# Patient Record
Sex: Female | Born: 1990 | Race: Black or African American | Hispanic: No | Marital: Single | Smoking: Current some day smoker
Health system: Southern US, Community
[De-identification: ages and names within clinical notes are randomized; demographics above are authoritative.]

## PROBLEM LIST (undated history)

## (undated) DIAGNOSIS — G43909 Migraine, unspecified, not intractable, without status migrainosus: Secondary | ICD-10-CM

## (undated) HISTORY — PX: WISDOM TOOTH EXTRACTION: SHX21

---

## 2009-11-12 ENCOUNTER — Emergency Department: Payer: Self-pay | Admitting: Unknown Physician Specialty

## 2010-03-29 ENCOUNTER — Emergency Department (HOSPITAL_COMMUNITY)
Admission: EM | Admit: 2010-03-29 | Discharge: 2010-03-29 | Payer: Self-pay | Source: Home / Self Care | Admitting: Emergency Medicine

## 2010-03-29 LAB — DIFFERENTIAL
Basophils Absolute: 0.1 10*3/uL (ref 0.0–0.1)
Lymphocytes Relative: 25 % (ref 12–46)
Lymphs Abs: 2 10*3/uL (ref 0.7–4.0)
Monocytes Absolute: 0.4 10*3/uL (ref 0.1–1.0)
Neutro Abs: 5.8 10*3/uL (ref 1.7–7.7)

## 2010-03-29 LAB — URINE MICROSCOPIC-ADD ON

## 2010-03-29 LAB — URINALYSIS, ROUTINE W REFLEX MICROSCOPIC
Specific Gravity, Urine: 1.033 — ABNORMAL HIGH (ref 1.005–1.030)
Urine Glucose, Fasting: NEGATIVE mg/dL
Urobilinogen, UA: 1 mg/dL (ref 0.0–1.0)
pH: 6 (ref 5.0–8.0)

## 2010-03-29 LAB — BASIC METABOLIC PANEL
CO2: 21 mEq/L (ref 19–32)
Chloride: 111 mEq/L (ref 96–112)
GFR calc Af Amer: >60 mL/min (ref >60)
Sodium: 140 mEq/L (ref 135–145)

## 2010-03-29 LAB — CBC
HCT: 39.5 % (ref 36.0–46.0)
Hemoglobin: 13.1 g/dL (ref 12.0–15.0)
WBC: 8.3 10*3/uL (ref 4.0–10.5)

## 2010-11-01 ENCOUNTER — Inpatient Hospital Stay (INDEPENDENT_AMBULATORY_CARE_PROVIDER_SITE_OTHER)
Admission: RE | Admit: 2010-11-01 | Discharge: 2010-11-01 | Disposition: A | Discharge status: Home / Self Care | Payer: Medicaid Other | Source: Ambulatory Visit | Attending: Emergency Medicine | Admitting: Emergency Medicine

## 2010-11-01 DIAGNOSIS — K089 Disorder of teeth and supporting structures, unspecified: Secondary | ICD-10-CM

## 2011-02-25 ENCOUNTER — Encounter: Payer: Self-pay | Admitting: *Deleted

## 2011-02-25 ENCOUNTER — Emergency Department (HOSPITAL_COMMUNITY)
Admission: EM | Admit: 2011-02-25 | Discharge: 2011-02-25 | Disposition: A | Discharge status: Home / Self Care | Payer: Medicaid Other | Attending: Emergency Medicine | Admitting: Emergency Medicine

## 2011-02-25 DIAGNOSIS — K0889 Other specified disorders of teeth and supporting structures: Secondary | ICD-10-CM

## 2011-02-25 DIAGNOSIS — K089 Disorder of teeth and supporting structures, unspecified: Secondary | ICD-10-CM | POA: Insufficient documentation

## 2011-02-25 DIAGNOSIS — R112 Nausea with vomiting, unspecified: Secondary | ICD-10-CM | POA: Insufficient documentation

## 2011-02-25 DIAGNOSIS — M2550 Pain in unspecified joint: Secondary | ICD-10-CM | POA: Insufficient documentation

## 2011-02-25 DIAGNOSIS — R110 Nausea: Secondary | ICD-10-CM

## 2011-02-25 DIAGNOSIS — F17200 Nicotine dependence, unspecified, uncomplicated: Secondary | ICD-10-CM | POA: Insufficient documentation

## 2011-02-25 DIAGNOSIS — IMO0001 Reserved for inherently not codable concepts without codable children: Secondary | ICD-10-CM | POA: Insufficient documentation

## 2011-02-25 MED ORDER — HYDROCODONE-ACETAMINOPHEN 5-325 MG PO TABS: 2.0000 | ORAL_TABLET | ORAL | Status: AC | PRN

## 2011-02-25 MED ORDER — ONDANSETRON HCL 4 MG PO TABS: 4.0000 mg | ORAL_TABLET | Freq: Four times a day (QID) | ORAL | Status: AC

## 2011-02-25 MED ORDER — ONDANSETRON 4 MG PO TBDP
4.0000 mg | ORAL_TABLET | Freq: Once | ORAL | Status: AC
  Administered 2011-02-25: 4 mg via ORAL
  Filled 2011-02-25: qty 1

## 2011-02-25 MED ORDER — IBUPROFEN 800 MG PO TABS
800.0000 mg | ORAL_TABLET | Freq: Once | ORAL | Status: AC
  Administered 2011-02-25: 800 mg via ORAL
  Filled 2011-02-25: qty 1

## 2011-02-25 NOTE — ED Provider Notes (Signed)
History     CSN: [id number]  Arrival date & time 02/25/11  1716   First MD Initiated Contact with Patient 02/25/11 1733      Chief Complaint  Patient presents with  . Dental Pain    (Consider location/radiation/quality/duration/timing/severity/associated sxs/prior treatment) HPI Comments: Patient presents with right upper incisor pain for the past week. Denies any pressure to the tooth, difficulty breathing or swallowing. She also complaints of 2 weeks of nausea with one 2 episodes of emesis a day. No abdominal pain, chest pain, diarrhea or fever. She also has some pain in her right wrist after hitting it against a window 2 weeks ago. Denies any weakness numbness or tingling.  The history is provided by the patient.    History reviewed. No pertinent past medical history.  History reviewed. No pertinent past surgical history.  History reviewed. No pertinent family history.  History  Substance Use Topics  . Smoking status: Passive Smoker    Types: Cigarettes  . Smokeless tobacco: Not on file  . Alcohol Use: Yes    OB History    Grav Para Term Preterm Abortions TAB SAB Ect Mult Living                  Review of Systems  Constitutional: Positive for appetite change. Negative for fever and activity change.  HENT: Positive for dental problem. Negative for trouble swallowing.   Respiratory: Negative for cough, chest tightness and shortness of breath.   Cardiovascular: Negative for chest pain.  Gastrointestinal: Positive for nausea and vomiting. Negative for abdominal pain.  Genitourinary: Negative for dysuria and hematuria.  Musculoskeletal: Positive for myalgias and arthralgias.  Skin: Negative for rash.  Neurological: Negative for weakness and headaches.    Allergies  Review of patient's allergies indicates no known allergies.  Home Medications   Current Outpatient Rx  Name Route Sig Dispense Refill  . EXCEDRIN PO Oral Take 2 tablets by mouth once as needed. For  headaches     . MICONAZOLE NITRATE 1200-2 MG-% VA KIT Vaginal Place 1 each vaginally once.      Marland Kitchen HYDROCODONE-ACETAMINOPHEN 5-325 MG PO TABS Oral Take 2 tablets by mouth every 4 (four) hours as needed for pain. 10 tablet 0  . ONDANSETRON HCL 4 MG PO TABS Oral Take 1 tablet (4 mg total) by mouth every 6 (six) hours. 12 tablet 0    BP 120/74  Pulse 84  Temp(Src) 98.8 F (37.1 C) (Oral)  Resp 14  Ht 5' (1.524 m)  Wt 115 lb (52.164 kg)  BMI 22.46 kg/m2  SpO2 100%  Physical Exam  Constitutional: She is oriented to person, place, and time. She appears well-developed and well-nourished. No distress.  HENT:  Head: Normocephalic and atraumatic.  Mouth/Throat: Oropharynx is clear and moist. No oropharyngeal exudate.       Right upper incisor tender to palpation, no abscess, Floor of mouth soft, no trismus  Eyes: Conjunctivae and EOM are normal. Pupils are equal, round, and reactive to light.  Neck: Normal range of motion. Neck supple.  Cardiovascular: Normal rate, regular rhythm and normal heart sounds.   Pulmonary/Chest: Effort normal and breath sounds normal.  Abdominal: Soft. There is no tenderness. There is no rebound and no guarding.  Musculoskeletal: Normal range of motion. She exhibits no edema and no tenderness.  Neurological: She is alert and oriented to person, place, and time. No cranial nerve deficit.  Skin: Skin is warm.    ED Course  Procedures (including critical  care time)   Labs Reviewed  PREGNANCY, URINE   No results found.   1. Pain, dental   2. Nausea       MDM  Dental pain without evidence of abscess., Nausea and vomiting with no abdominal pain. Abdomen soft nontender on exam. Patient well-hydrated.  Pregnancy test checked given complaint of nausea. Tolerating by mouth in the department with no emesis.       Glynn Octave, MD 02/25/11 [phone]

## 2011-02-25 NOTE — ED Notes (Signed)
Patient with tooth pain for about a week and also nauseated for 2 weeks

## 2011-04-29 ENCOUNTER — Emergency Department (HOSPITAL_COMMUNITY)
Admission: EM | Admit: 2011-04-29 | Discharge: 2011-04-29 | Disposition: A | Discharge status: Home / Self Care | Payer: Medicaid Other | Attending: Emergency Medicine | Admitting: Emergency Medicine

## 2011-04-29 ENCOUNTER — Encounter (HOSPITAL_COMMUNITY): Payer: Self-pay | Admitting: *Deleted

## 2011-04-29 ENCOUNTER — Emergency Department (HOSPITAL_COMMUNITY): Payer: Medicaid Other

## 2011-04-29 DIAGNOSIS — K029 Dental caries, unspecified: Secondary | ICD-10-CM | POA: Insufficient documentation

## 2011-04-29 DIAGNOSIS — K0889 Other specified disorders of teeth and supporting structures: Secondary | ICD-10-CM

## 2011-04-29 DIAGNOSIS — R220 Localized swelling, mass and lump, head: Secondary | ICD-10-CM | POA: Insufficient documentation

## 2011-04-29 DIAGNOSIS — M25569 Pain in unspecified knee: Secondary | ICD-10-CM | POA: Insufficient documentation

## 2011-04-29 DIAGNOSIS — K089 Disorder of teeth and supporting structures, unspecified: Secondary | ICD-10-CM | POA: Insufficient documentation

## 2011-04-29 MED ORDER — FLUCONAZOLE 150 MG PO TABS: 150.0000 mg | ORAL_TABLET | Freq: Once | ORAL | Status: AC

## 2011-04-29 MED ORDER — PENICILLIN V POTASSIUM 500 MG PO TABS: 500.0000 mg | ORAL_TABLET | Freq: Four times a day (QID) | ORAL | Status: AC

## 2011-04-29 MED ORDER — IBUPROFEN 800 MG PO TABS: 800.0000 mg | ORAL_TABLET | Freq: Three times a day (TID) | ORAL | Status: AC

## 2011-04-29 MED ORDER — FLUCONAZOLE 150 MG PO TABS: 150.0000 mg | ORAL_TABLET | Freq: Once | ORAL | Status: DC

## 2011-04-29 NOTE — ED Provider Notes (Signed)
History     CSN: [id number]  Arrival date & time 04/29/11  1637   First MD Initiated Contact with Patient 04/29/11 1640      Chief Complaint  Patient presents with  . Dental Pain  . Knee Pain    (Consider location/radiation/quality/duration/timing/severity/associated sxs/prior treatment) HPI Comments: Patient is sexually active and is also concerned that she may be pregnant.  Patient is requesting a pregnancy test.  Patient is a 21 y.o. female presenting with tooth pain and knee pain. The history is provided by the patient.  Dental PainThe primary symptoms include mouth pain. Primary symptoms do not include dental injury, oral bleeding, oral lesions, fever or sore throat. Episode onset: 3-4 weeks ago. The symptoms are worsening.  Additional symptoms include: dental sensitivity to temperature, gum swelling, gum tenderness and taste disturbance. Additional symptoms do not include: purulent gums, trismus, facial swelling, trouble swallowing, drooling, ear pain and swollen glands. Medical issues do not include: periodontal disease.   Knee Pain This is a new problem. Episode onset: 3-4 weeks. The problem has been unchanged. Pertinent negatives include no chills, fever, joint swelling, neck pain, numbness, rash, sore throat, swollen glands or weakness. The symptoms are aggravated by bending and walking. She has tried NSAIDs for the symptoms. The treatment provided mild relief.    History reviewed. No pertinent past medical history.  Past Surgical History  Procedure Date  . Wisdom tooth extraction     No family history on file.  History  Substance Use Topics  . Smoking status: Passive Smoker    Types: Cigarettes  . Smokeless tobacco: Not on file  . Alcohol Use: Yes    OB History    Grav Para Term Preterm Abortions TAB SAB Ect Mult Living                  Review of Systems  Constitutional: Negative for fever and chills.  HENT: Negative for ear pain, sore throat, facial  swelling, drooling, trouble swallowing, neck pain and neck stiffness.   Musculoskeletal: Negative for joint swelling and gait problem.  Skin: Negative for rash.  Neurological: Negative for dizziness, syncope, weakness and numbness.    Allergies  Aspirin  Home Medications   Current Outpatient Rx  Name Route Sig Dispense Refill  . EXCEDRIN PO Oral Take 2 tablets by mouth daily as needed. For headaches    . IBUPROFEN 200 MG PO TABS Oral Take 400 mg by mouth every 8 (eight) hours as needed. For pain.      BP 116/69  Pulse 83  Temp(Src) 98.6 F (37 C) (Oral)  Resp 22  Ht 5' (1.524 m)  Wt 123 lb (55.792 kg)  BMI 24.02 kg/m2  SpO2 100%  LMP 04/11/2011  Physical Exam  Nursing note and vitals reviewed. Constitutional: She is oriented to person, place, and time. She appears well-developed and well-nourished. No distress.  HENT:  Head: Normocephalic and atraumatic. No trismus in the jaw.  Right Ear: Tympanic membrane normal.  Left Ear: Tympanic membrane normal.  Nose: Nose normal.  Mouth/Throat: Uvula is midline, oropharynx is clear and moist and mucous membranes are normal. She does not have dentures. No oral lesions. Abnormal dentition. Dental caries present. No dental abscesses or uvula swelling.       No trismus.   No submandibular tightness or swelling. No abscess visualized. Mild swelling and tenderness of gingiva.   Right upper tooth decay.    Neck: Normal range of motion. Neck supple.  Cardiovascular: Normal rate,  regular rhythm and normal heart sounds.   Pulmonary/Chest: Effort normal and breath sounds normal. No respiratory distress.  Musculoskeletal: Normal range of motion.       Right knee: She exhibits bony tenderness. She exhibits normal range of motion, no swelling, no effusion, no ecchymosis, no deformity, no erythema, no LCL laxity, normal patellar mobility, normal meniscus and no MCL laxity. tenderness found.  Neurological: She is alert and oriented to person,  place, and time.  Skin: Skin is warm and dry. No rash noted. She is not diaphoretic. No erythema.  Psychiatric: She has a normal mood and affect.    ED Course  Procedures (including critical care time)  Labs Reviewed - No data to display No results found.   No diagnosis found.    MDM  Patient with toothache.  No gross abscess.  Exam unconcerning for Ludwig's angina or spread of infection.  Will treat with penicillin and pain medicine.  Urged patient to follow-up with dentist.  Patient with negative xrays of knee.  Patient given knee sleeve.          Pascal Lux Richmond, PA-C 04/30/11 0126

## 2011-04-29 NOTE — Discharge Instructions (Signed)
You have a dental injury. Use the resource guide listed below to help you find a dentist if you do not already have one to followup with. It is very important that you get evaluated by a dentist as soon as possible. Call tomorrow to schedule an appointment.. Take your full course of antibiotics. Read the instructions below.  Eat a soft or liquid diet and rinse your mouth out after meals with warm water. You should see a dentist or return here at once if you have increased swelling, increased pain or uncontrolled bleeding from the site of your injury.   SEEK MEDICAL CARE IF:   You have increased pain not controlled with medicines.   You have swelling around your tooth, in your face or neck.   You have bleeding which starts, continues, or gets worse.   You have a fever >101  If you are unable to open your mouth  RESOURCE GUIDE  Dental Problems  Patients with Medicaid: [facility] [phone] [address]                                           [phone] [address] Phone:  [phone]                                                  Phone:  [phone]  If unable to pay or uninsured, contact:  [facility] or [facility]. to become qualified for the adult dental clinic.  Chronic Pain Problems Contact [facility]  [phone] Patients need to be referred by their primary care doctor.  Insufficient Money for Medicine Contact [employer]:  call "211" or [facility] 228-104-0208.  No Primary Care Doctor Call [facility]  [phone] Other agencies that provide inexpensive medical care    [facility]  [phone]    [facility]  [phone]    [facility]  [phone]    [facility]  [phone]    [facility]  [phone]    [facility]  [phone]  [facility]  [phone] [facility] Services  [phone] [facility]  Mental Health   [phone] (emergency services [phone])  Substance Abuse Resources [facility]  [phone] [facility] [phone] [facility] [phone] Floydene Flock [phone] Residential & Outpatient Substance Abuse Program  ([phone]  Abuse/Neglect [facility] Child Abuse Hotline 830-795-0127 [facility] Child Abuse Hotline (458)504-5139 (After Hours)  Emergency Shelter [facility] [phone]  [facility] [place] [facility] [phone] [facility] [phone]  MRSA Hotline #:   (727)787-1907    [facility] Resources  [facility]     [employer]                          [facility] Dept. [address] [place]  [address]      [address]  Blondell Reveal Phone:  [phone]                                   Phone:  [phone]                 Phone:  [phone]  [facility] Mental Health Phone:  [phone]  [facility] Child Abuse Hotline [phone] 731-212-4687 (After Hours)   Be sure to read and understand instructions below prior to leaving the hospital. If your symptoms persist without any improvement in 1 week it is recommended that you follow up with your primary care physician.   Knee Effusion  The medical term for having fluid in your knee is effusion.This means something is wrong inside the knee. Some of the causes of fluid in the knee may be torn cartilage, a torn ligament, or bleeding into the joint from an injury. Small tears may heal on their own with conservative treatment. Conservative means rest, limited weight bearing activity and muscle strengthening exercises. Your recovery may take up to 6 weeks. Larger tears may require surgery.   TREATMENT  Rest, ice, elevation, and  compression are the basic modes of treatment.   Apply ice to the sore area for 15 to 20 minutes, 3 to 4 times per day. Do this while you are awake for the first 2 days, or as directed. This can be stopped when the swelling goes away. Put the ice in a plastic bag and place a towel between the bag of ice and your skin.  Keep your leg elevated when possible to lessen swelling.  If your caregiver recommends crutches, use them as instructed for 1 week. Then, you may walk as tolerated.  Do not drive a vehicle on pain medication. ACTIVITY:            - Weight bearing as tolerated            - Exercises should be limited to pain free range of motion  Knee Immobilization:: This is used to support and protect an injured or painful knee. Knee immobilizers keep your knee from being used while it is healing.  Use powder to control irritation from sweat and friction.  Adjust the immobilizer to be firm but not tight. Signs of an immobilizer that is too tight include:   Swelling.   Numbness.   Color change in your foot or ankle.   Increased pain.  While resting, raise your leg above the level of your heart. This reduces throbbing and helps healing. Prop it up with pillows.  Remove the immobilizer to bathe and sleep. Wear it other times until you see your doctor again.               SEEK MEDICAL CARE IF:  You have an increase in bruising, swelling, or pain.  Your toes feel cold.  Pain relief is not achieved with medications.  EMERGENCY:: Your toes are numb or blue or you have severe pain.  You notice redness, swelling, warmth or increasing pain in your knee.  An unexplained oral temperature above 102 F (38.9 C) develops.  COLD THERAPY DIRECTIONS:  Ice or gel packs can be used to reduce both pain and swelling. Ice is the most helpful within the first 24 to 48 hours after an injury or flareup from overusing a muscle or joint.  Ice is effective, has very few side effects, and is safe for most people to  use.   If you expose your skin to cold temperatures for too long or without the proper protection, you can damage your skin or nerves. Watch for signs of skin damage due to cold.   HOME CARE INSTRUCTIONS  Follow these tips to use ice and cold packs safely.  Place a dry or damp towel between the ice and skin. A damp towel will cool the skin more quickly, so you may need to shorten the time that the ice is used.  For a more rapid response, add gentle compression to the ice.  Ice for no more than 10 to 20 minutes at a time. The bonier the area you are icing, the less time it will take to get the benefits of ice.  Check your skin after 5 minutes to make sure there are no signs of a poor response to cold or skin damage.  Rest 20 minutes or more in between uses.  Once your skin is numb, you can end your treatment. You can test numbness by very lightly touching your skin. The touch should be so light that you do not see the skin dimple from the pressure of your fingertip. When using ice, most people will feel these normal sensations in this order: cold, burning, aching, and numbness.  Do not use ice on someone who cannot communicate their responses to pain, such as small children or people with dementia.   HOW TO MAKE AN ICE PACK  To make an ice pack, do one of the following:  Place crushed ice or a bag of frozen vegetables in a sealable plastic bag. Squeeze out the excess air. Place this bag inside another plastic bag. Slide the bag into a pillowcase or place a damp towel between your skin and the bag.  Mix 3 parts water with 1 part rubbing alcohol. Freeze the mixture in a sealable plastic bag. When you remove the mixture from the freezer, it will be slushy. Squeeze out the excess air. Place this bag inside another plastic bag. Slide the bag into a pillowcase or place a damp towel between your s

## 2011-04-29 NOTE — ED Notes (Signed)
Ortho called to apply knee sleeve 

## 2011-04-29 NOTE — Progress Notes (Signed)
No pcp listed for pt.  Spoke with pt who confirms she has moved from another [employer] about a year ago but did not obtain a new [place] [employer]. Provided pt with [facility] contact number for assistance with finding a medicaid pcp near her residence and other [place] resources and possible available medicaid pcps

## 2011-04-29 NOTE — ED Notes (Signed)
Pt states she thinks her filling has came out of her tooth. Pt states she fills as if air is getting into her tooth. Pt denies any fever. Pt states she is also having right knee pain. Pt denies any fall she states her knee just hurts

## 2011-04-30 LAB — POCT PREGNANCY, URINE: Preg Test, Ur: NEGATIVE

## 2011-05-01 NOTE — ED Provider Notes (Signed)
Medical screening examination/treatment/procedure(s) were performed by non-physician practitioner and as supervising physician I was immediately available for consultation/collaboration. Keslee Harrington Y.   Geraldo Haris Y. Kelley Knoth, MD 05/01/11 0117 

## 2011-06-05 ENCOUNTER — Emergency Department (HOSPITAL_COMMUNITY)
Admission: EM | Admit: 2011-06-05 | Discharge: 2011-06-05 | Disposition: A | Discharge status: Home / Self Care | Payer: Self-pay | Attending: Emergency Medicine | Admitting: Emergency Medicine

## 2011-06-05 ENCOUNTER — Encounter (HOSPITAL_COMMUNITY): Payer: Self-pay | Admitting: Emergency Medicine

## 2011-06-05 DIAGNOSIS — K0889 Other specified disorders of teeth and supporting structures: Secondary | ICD-10-CM

## 2011-06-05 DIAGNOSIS — F17200 Nicotine dependence, unspecified, uncomplicated: Secondary | ICD-10-CM | POA: Insufficient documentation

## 2011-06-05 DIAGNOSIS — K029 Dental caries, unspecified: Secondary | ICD-10-CM | POA: Insufficient documentation

## 2011-06-05 DIAGNOSIS — Z886 Allergy status to analgesic agent status: Secondary | ICD-10-CM | POA: Insufficient documentation

## 2011-06-05 MED ORDER — PENICILLIN V POTASSIUM 500 MG PO TABS: 500.0000 mg | ORAL_TABLET | Freq: Three times a day (TID) | ORAL | Status: AC

## 2011-06-05 NOTE — Discharge Instructions (Signed)
Dental Pain  A tooth ache may be caused by cavities (tooth decay). Cavities expose the nerve of the tooth to air and hot or cold temperatures. It may come from an infection or abscess (also called a boil or furuncle) around your tooth. It is also often caused by dental caries (tooth decay). This causes the pain you are having.  DIAGNOSIS   Your caregiver can diagnose this problem by exam.  TREATMENT   · If caused by an infection, it may be treated with medications which kill germs (antibiotics) and pain medications as prescribed by your caregiver. Take medications as directed.  · Only take over-the-counter or prescription medicines for pain, discomfort, or fever as directed by your caregiver.  · Whether the tooth ache today is caused by infection or dental disease, you should see your dentist as soon as possible for further care.  SEEK MEDICAL CARE IF:  The exam and treatment you received today has been provided on an emergency basis only. This is not a substitute for complete medical or dental care. If your problem worsens or new problems (symptoms) appear, and you are unable to meet with your dentist, call or return to this location.  SEEK IMMEDIATE MEDICAL CARE IF:   · You have a fever.  · You develop redness and swelling of your face, jaw, or neck.  · You are unable to open your mouth.  · You have severe pain uncontrolled by pain medicine.  MAKE SURE YOU:   · Understand these instructions.  · Will watch your condition.  · Will get help right away if you are not doing well or get worse.  Document Released: 02/18/2005 Document Revised: 02/07/2011 Document Reviewed: 10/07/2007  [employer]® Patient Information ©2012 [employer].

## 2011-06-05 NOTE — ED Notes (Signed)
Pt missed scheduled appt with dr hatcher today; now being referred to oral surgeon for tooth extraction; pt called and given phone number for dr Chales Salmon  [phone] to make appt.

## 2011-06-05 NOTE — ED Notes (Signed)
Pt reports that this is second visit for same concern. Unable to follow up with dentist after last visit

## 2011-06-05 NOTE — ED Provider Notes (Signed)
Medical screening examination/treatment/procedure(s) were performed by non-physician practitioner and as supervising physician I was immediately available for consultation/collaboration.  Doug Sou, MD 06/05/11 1606

## 2011-06-05 NOTE — ED Provider Notes (Signed)
History     CSN: [id number]  Arrival date & time 06/05/11  1123   First MD Initiated Contact with Patient 06/05/11 1138      No chief complaint on file.   (Consider location/radiation/quality/duration/timing/severity/associated sxs/prior treatment) HPI  A healthy 21 year old female presents with a chief complaint of dental pain. Patient states she has been having pain to the right upper premolar for the past 6 weeks. Pain is intermittent, gradual onset, usually lasting for minutes to hours, and improves with ibuprofen. States pain worsened with cold air, cold water or hot water. She denies fever, headache, throat swelling, rash, recent trauma, neck pain, or jaw pain. States she has been seen by a dentist 4 weeks ago, who mentioned that she would benefit from having a dental extraction or root canal. Patient states she cannot afford for the procedure at this time. She is here requesting for pain control. Patient is not a smoker.  No past medical history on file.  Past Surgical History  Procedure Date  . Wisdom tooth extraction     No family history on file.  History  Substance Use Topics  . Smoking status: Passive Smoker    Types: Cigarettes  . Smokeless tobacco: Not on file  . Alcohol Use: Yes    OB History    Grav Para Term Preterm Abortions TAB SAB Ect Mult Living                  Review of Systems  Constitutional: Negative.   HENT: Positive for dental problem. Negative for ear pain, sore throat, facial swelling, neck pain and ear discharge.   Respiratory: Negative for cough.   Skin: Negative.   Neurological: Negative for headaches.    Allergies  Aspirin  Home Medications   Current Outpatient Rx  Name Route Sig Dispense Refill  . EXCEDRIN PO Oral Take 2 tablets by mouth daily as needed. For headaches    . IBUPROFEN 200 MG PO TABS Oral Take 400 mg by mouth every 8 (eight) hours as needed. For pain.      There were no vitals taken for this visit.  Physical  Exam  Nursing note and vitals reviewed. Constitutional: She appears well-developed and well-nourished. No distress.  HENT:  Head: Normocephalic and atraumatic.  Right Ear: External ear normal.  Left Ear: External ear normal.  Nose: Nose normal.  Mouth/Throat: Oropharynx is clear and moist. No oropharyngeal exudate.    Musculoskeletal: She exhibits no tenderness.  Neurological: She is alert.  Skin: Skin is warm.  Psychiatric: She has a normal mood and affect.    ED Course  Procedures (including critical care time)  Labs Reviewed - No data to display No results found.   No diagnosis found.    MDM  Dental pain, ongoing for 6 weeks. I suggest patient to follow with a dentist, which I will give referral. I explained to patient that her symptoms will be best treated by a specialist. I encouraged patient to take Tylenol or ibuprofen for pain. Patient agrees.        Fayrene Helper, PA-C 06/05/11 1152

## 2012-03-09 ENCOUNTER — Emergency Department (HOSPITAL_COMMUNITY)
Admission: EM | Admit: 2012-03-09 | Discharge: 2012-03-09 | Disposition: A | Discharge status: Home / Self Care | Payer: Self-pay | Attending: Emergency Medicine | Admitting: Emergency Medicine

## 2012-03-09 ENCOUNTER — Encounter (HOSPITAL_COMMUNITY): Payer: Self-pay | Admitting: Emergency Medicine

## 2012-03-09 DIAGNOSIS — R112 Nausea with vomiting, unspecified: Secondary | ICD-10-CM | POA: Insufficient documentation

## 2012-03-09 DIAGNOSIS — Z8679 Personal history of other diseases of the circulatory system: Secondary | ICD-10-CM | POA: Insufficient documentation

## 2012-03-09 DIAGNOSIS — H9209 Otalgia, unspecified ear: Secondary | ICD-10-CM | POA: Insufficient documentation

## 2012-03-09 DIAGNOSIS — R05 Cough: Secondary | ICD-10-CM | POA: Insufficient documentation

## 2012-03-09 DIAGNOSIS — M545 Low back pain, unspecified: Secondary | ICD-10-CM | POA: Insufficient documentation

## 2012-03-09 DIAGNOSIS — R109 Unspecified abdominal pain: Secondary | ICD-10-CM | POA: Insufficient documentation

## 2012-03-09 DIAGNOSIS — F17200 Nicotine dependence, unspecified, uncomplicated: Secondary | ICD-10-CM | POA: Insufficient documentation

## 2012-03-09 DIAGNOSIS — R059 Cough, unspecified: Secondary | ICD-10-CM | POA: Insufficient documentation

## 2012-03-09 DIAGNOSIS — J029 Acute pharyngitis, unspecified: Secondary | ICD-10-CM | POA: Insufficient documentation

## 2012-03-09 DIAGNOSIS — Z3202 Encounter for pregnancy test, result negative: Secondary | ICD-10-CM | POA: Insufficient documentation

## 2012-03-09 DIAGNOSIS — R6889 Other general symptoms and signs: Secondary | ICD-10-CM

## 2012-03-09 DIAGNOSIS — J3489 Other specified disorders of nose and nasal sinuses: Secondary | ICD-10-CM | POA: Insufficient documentation

## 2012-03-09 HISTORY — DX: Migraine, unspecified, not intractable, without status migrainosus: G43.909

## 2012-03-09 LAB — URINALYSIS, ROUTINE W REFLEX MICROSCOPIC
Glucose, UA: NEGATIVE mg/dL
Ketones, ur: NEGATIVE mg/dL
Leukocytes, UA: NEGATIVE
pH: 7 (ref 5.0–8.0)

## 2012-03-09 MED ORDER — DEXTROMETHORPHAN POLISTIREX 30 MG/5ML PO LQCR: 60.0000 mg | ORAL | Status: DC | PRN

## 2012-03-09 MED ORDER — OSELTAMIVIR PHOSPHATE 75 MG PO CAPS: 75.0000 mg | ORAL_CAPSULE | Freq: Two times a day (BID) | ORAL | Status: DC

## 2012-03-09 NOTE — ED Provider Notes (Signed)
History     CSN: [id number]  Arrival date & time 03/09/12  4696   First MD Initiated Contact with Patient 03/09/12 [phone]      Chief Complaint  Patient presents with  . Influenza    (Consider location/radiation/quality/duration/timing/severity/associated sxs/prior treatment) HPI Comments: This is a 22 year old female, who presents to the ED with a chief complaint of flu-like symptoms.  Patient states that she has had earache, sore throat, cough, congestion, nausea, and vomiting.  The patient states that she also complains of low back and abdominal pain, which has been associated with the nausea and vomiting.  The patient states that she has taken cough and cold medicine, with some relief.  She states that her level of discomfort is 8/10.  Her symptoms began Wednesday night.  The history is provided by the patient. No language interpreter was used.    Past Medical History  Diagnosis Date  . Migraine     Past Surgical History  Procedure Date  . Wisdom tooth extraction     Family History  Problem Relation Age of Onset  . Hypertension Other   . Migraines Other     History  Substance Use Topics  . Smoking status: Current Some Day Smoker    Types: Cigarettes  . Smokeless tobacco: Not on file  . Alcohol Use: Yes     Comment: occ    OB History    Grav Para Term Preterm Abortions TAB SAB Ect Mult Living                  Review of Systems  All other systems reviewed and are negative.    Allergies  Aspirin  Home Medications   Current Outpatient Rx  Name  Route  Sig  Dispense  Refill  . EXCEDRIN PO   Oral   Take 2 tablets by mouth daily as needed. For headaches         . IBUPROFEN 200 MG PO TABS   Oral   Take 400 mg by mouth every 8 (eight) hours as needed. For pain.           BP 131/79  Pulse 98  Temp 99 F (37.2 C) (Oral)  Resp 20  SpO2 100%  LMP 02/26/2012  Physical Exam  Nursing note and vitals reviewed. Constitutional: She is oriented to  person, place, and time. She appears well-developed and well-nourished.  HENT:  Head: Normocephalic and atraumatic.       Red oropharynx with out exudates, or signs of tonsillar or peritonsillar abscess, uvula is midline, no signs of Ludwig's angina.  Eyes: Conjunctivae normal and EOM are normal. Pupils are equal, round, and reactive to light.  Neck: Normal range of motion. Neck supple.  Cardiovascular: Normal rate and regular rhythm.  Exam reveals no gallop and no friction rub.   No murmur heard. Pulmonary/Chest: Effort normal and breath sounds normal. No respiratory distress. She has no wheezes. She has no rales. She exhibits no tenderness.  Abdominal: Soft. Bowel sounds are normal. She exhibits no distension and no mass. There is no tenderness. There is no rebound and no guarding.       No RUQ tenderness, no pain at McBurney's point, no peritoneal signs, no rebound tenderness.  Musculoskeletal: Normal range of motion. She exhibits no edema and no tenderness.  Neurological: She is alert and oriented to person, place, and time.  Skin: Skin is warm and dry.  Psychiatric: She has a normal mood and affect. Her  behavior is normal. Judgment and thought content normal.    ED Course  Procedures (including critical care time)  Labs Reviewed  URINALYSIS, ROUTINE W REFLEX MICROSCOPIC - Abnormal; Notable for the following:    APPearance CLOUDY (*)     Specific Gravity, Urine 1.031 (*)     All other components within normal limits  POCT PREGNANCY, URINE   Results for orders placed during the hospital encounter of 03/09/12  URINALYSIS, ROUTINE W REFLEX MICROSCOPIC      Component Value Range   Color, Urine YELLOW  YELLOW   APPearance CLOUDY (*) CLEAR   Specific Gravity, Urine 1.031 (*) 1.005 - 1.030   pH 7.0  5.0 - 8.0   Glucose, UA NEGATIVE  NEGATIVE mg/dL   Hgb urine dipstick NEGATIVE  NEGATIVE   Bilirubin Urine NEGATIVE  NEGATIVE   Ketones, ur NEGATIVE  NEGATIVE mg/dL   Protein, ur  NEGATIVE  NEGATIVE mg/dL   Urobilinogen, UA 1.0  0.0 - 1.0 mg/dL   Nitrite NEGATIVE  NEGATIVE   Leukocytes, UA NEGATIVE  NEGATIVE  POCT PREGNANCY, URINE      Component Value Range   Preg Test, Ur NEGATIVE  NEGATIVE       1. Flu-like symptoms       MDM  22 year old female with flu-like symptoms.  Will treat the patient with Tamiflu, and will recommend symptomatic treatment with Delsym cough syrup, rehydration, and rest.  Discussed specific return precautions regarding the vague abdominal complaints.  Believe the abdominal pain to be related to the nausea and vomiting, no rebound tenderness or focal tenderness, no dysuria, or vaginal discharge.  Will treat conservatively at this time.  The patient agrees to return for worsening symptoms.  She is stable and ready for discharge.  The patient understands and agrees with the plan.         Roxy Horseman, PA-C 03/09/12 [phone]

## 2012-03-09 NOTE — ED Notes (Signed)
Pt presents with c/o earache, headache, sore throat, cough, congestion  Sxs started late Wed night early Thurs morning  Pt states she has also been having lower abd pain with pain in her lower back  Denies urinary sxs

## 2012-03-10 NOTE — ED Provider Notes (Signed)
Medical screening examination/treatment/procedure(s) were performed by non-physician practitioner and as supervising physician I was immediately available for consultation/collaboration.   Lyanne Co, MD 03/10/12 2119

## 2012-04-19 ENCOUNTER — Emergency Department (HOSPITAL_COMMUNITY)
Admission: EM | Admit: 2012-04-19 | Discharge: 2012-04-19 | Disposition: A | Discharge status: Home / Self Care | Payer: Self-pay | Attending: Emergency Medicine | Admitting: Emergency Medicine

## 2012-04-19 ENCOUNTER — Encounter (HOSPITAL_COMMUNITY): Payer: Self-pay | Admitting: Emergency Medicine

## 2012-04-19 DIAGNOSIS — F17200 Nicotine dependence, unspecified, uncomplicated: Secondary | ICD-10-CM | POA: Insufficient documentation

## 2012-04-19 DIAGNOSIS — N751 Abscess of Bartholin's gland: Secondary | ICD-10-CM | POA: Insufficient documentation

## 2012-04-19 DIAGNOSIS — Z7982 Long term (current) use of aspirin: Secondary | ICD-10-CM | POA: Insufficient documentation

## 2012-04-19 DIAGNOSIS — Z8679 Personal history of other diseases of the circulatory system: Secondary | ICD-10-CM | POA: Insufficient documentation

## 2012-04-19 DIAGNOSIS — N949 Unspecified condition associated with female genital organs and menstrual cycle: Secondary | ICD-10-CM | POA: Insufficient documentation

## 2012-04-19 MED ORDER — CEPHALEXIN 500 MG PO CAPS: 500.0000 mg | ORAL_CAPSULE | Freq: Two times a day (BID) | ORAL | Status: AC

## 2012-04-19 MED ORDER — HYDROCODONE-ACETAMINOPHEN 5-325 MG PO TABS: 1.0000 | ORAL_TABLET | Freq: Four times a day (QID) | ORAL | Status: AC | PRN

## 2012-04-19 NOTE — ED Provider Notes (Signed)
Medical screening examination/treatment/procedure(s) were performed by non-physician practitioner and as supervising physician I was immediately available for consultation/collaboration.  Olivia Mackie, MD 04/19/12 2113

## 2012-04-19 NOTE — ED Provider Notes (Signed)
History     CSN: [id number]  Arrival date & time 04/19/12  0534   First MD Initiated Contact with Patient 04/19/12 [phone]      Chief Complaint  Patient presents with  . Wound Check    (Consider location/radiation/quality/duration/timing/severity/associated sxs/prior treatment) HPI Cheryl Beck is a 22 y.o. female who presents with complaint of a vaginal abscess. States no hx of the same. Pain and swelling started 3 days ago. Worsening. Tried tylenol and warm rugs with no relief. It is not draining. No fever, chills, malaise. Pain worsened with movement and palpation. Nothing makes it better.    Past Medical History  Diagnosis Date  . Migraine     Past Surgical History  Procedure Laterality Date  . Wisdom tooth extraction      Family History  Problem Relation Age of Onset  . Hypertension Other   . Migraines Other     History  Substance Use Topics  . Smoking status: Current Some Day Smoker    Types: Cigarettes  . Smokeless tobacco: Not on file  . Alcohol Use: Yes     Comment: occ    OB History   Grav Para Term Preterm Abortions TAB SAB Ect Mult Living                  Review of Systems  Constitutional: Negative for fever and chills.  Genitourinary: Positive for vaginal pain.  Skin: Positive for wound.    Allergies  Aspirin  Home Medications   Current Outpatient Rx  Name  Route  Sig  Dispense  Refill  . Aspirin-Acetaminophen-Caffeine (EXCEDRIN PO)   Oral   Take 2 tablets by mouth daily as needed. For headaches           BP 137/83  Pulse 86  Temp(Src) 98.4 F (36.9 C) (Oral)  Resp 18  SpO2 100%  LMP 03/29/2012  Physical Exam  Nursing note and vitals reviewed. Constitutional: She appears well-developed and well-nourished. No distress.  Neck: Neck supple.  Cardiovascular: Normal rate, regular rhythm and normal heart sounds.   Pulmonary/Chest: Effort normal and breath sounds normal. No respiratory distress. She has no wheezes. She has no  rales.  Genitourinary:  Bartholin cyst abscess to the left labia. Tender to palpation. No drainage  Neurological: She is alert.  Skin: Skin is warm.    ED Course  Procedures (including critical care time)  INCISION AND DRAINAGE Performed by: Jaynie Crumble A Consent: Verbal consent obtained. Risks and benefits: risks, benefits and alternatives were discussed Type: abscess  Body area: left labia majora  Anesthesia: local infiltration  Incision was made with a scalpel.  Local anesthetic: lidocaine 2% wo epinephrine  Anesthetic total: 3 ml  Complexity: complex Blunt dissection to break up loculations  Drainage: purulent  Drainage amount: large  Packing material: no packing  Patient tolerance: Patient tolerated the procedure well with no immediate complications.    1. Bartholin's gland abscess       MDM  Pt with left labia major bartholin's gland abscess. Drained. Feeling better. No systemic symptoms. Home with pain mediations and antibiotics. Follow up in 2 days.         Lottie Mussel, [place] 04/19/12 [phone]

## 2012-04-19 NOTE — ED Notes (Signed)
Pt alert, arrives from home, c/o ? abscess to vagina, onset last week, states area painful, denies open or drainage

## 2012-04-22 ENCOUNTER — Emergency Department (HOSPITAL_COMMUNITY)
Admission: EM | Admit: 2012-04-22 | Discharge: 2012-04-22 | Disposition: A | Discharge status: Home / Self Care | Payer: Self-pay | Attending: Emergency Medicine | Admitting: Emergency Medicine

## 2012-04-22 ENCOUNTER — Encounter (HOSPITAL_COMMUNITY): Payer: Self-pay | Admitting: *Deleted

## 2012-04-22 DIAGNOSIS — R509 Fever, unspecified: Secondary | ICD-10-CM | POA: Insufficient documentation

## 2012-04-22 DIAGNOSIS — N751 Abscess of Bartholin's gland: Secondary | ICD-10-CM | POA: Insufficient documentation

## 2012-04-22 DIAGNOSIS — F17200 Nicotine dependence, unspecified, uncomplicated: Secondary | ICD-10-CM | POA: Insufficient documentation

## 2012-04-22 DIAGNOSIS — Z8679 Personal history of other diseases of the circulatory system: Secondary | ICD-10-CM | POA: Insufficient documentation

## 2012-04-22 DIAGNOSIS — N949 Unspecified condition associated with female genital organs and menstrual cycle: Secondary | ICD-10-CM | POA: Insufficient documentation

## 2012-04-22 MED ORDER — ONDANSETRON 8 MG PO TBDP
8.0000 mg | ORAL_TABLET | Freq: Once | ORAL | Status: AC
  Administered 2012-04-22: 8 mg via ORAL
  Filled 2012-04-22: qty 1

## 2012-04-22 MED ORDER — MORPHINE SULFATE 4 MG/ML IJ SOLN
6.0000 mg | Freq: Once | INTRAMUSCULAR | Status: AC
  Administered 2012-04-22: 6 mg via INTRAMUSCULAR
  Filled 2012-04-22: qty 2

## 2012-04-22 MED ORDER — LIDOCAINE HCL 1 % IJ SOLN
INTRAMUSCULAR | Status: AC
  Filled 2012-04-22: qty 20

## 2012-04-22 MED ORDER — FLUCONAZOLE 200 MG PO TABS: 200.0000 mg | ORAL_TABLET | Freq: Every day | ORAL | Status: AC

## 2012-04-22 NOTE — ED Notes (Addendum)
Pt reports she had an I&D on her vaginal abscess on Sunday - pt reports increased pain that began yesterday, tearful during assessment. Pt also admits to fever and chills.

## 2012-04-22 NOTE — ED Provider Notes (Signed)
History     CSN: [id number]  Arrival date & time 04/22/12  0208   First MD Initiated Contact with Patient 04/22/12 0232      Chief Complaint  Patient presents with  . Abscess   HPI  History provided by the patient and recent medical chart. Patient is a 22 year old female who returns with worsening pain and swelling of left Bartholin's abscess infection. Patient was seen a few days ago for similar symptoms with I&D performed. She has been taking Keflex and using Norco pain medications but states swelling has returned and pain increased significantly. Pain is worse with any manipulation or pressure. She denies any other associated symptoms. Denies any fever, chills or sweats.    Past Medical History  Diagnosis Date  . Migraine     Past Surgical History  Procedure Laterality Date  . Wisdom tooth extraction      Family History  Problem Relation Age of Onset  . Hypertension Other   . Migraines Other     History  Substance Use Topics  . Smoking status: Current Some Day Smoker    Types: Cigarettes  . Smokeless tobacco: Not on file  . Alcohol Use: Yes     Comment: occ    OB History   Grav Para Term Preterm Abortions TAB SAB Ect Mult Living                  Review of Systems  Constitutional: Negative for fever.  All other systems reviewed and are negative.    Allergies  Aspirin  Home Medications   Current Outpatient Rx  Name  Route  Sig  Dispense  Refill  . Aspirin-Acetaminophen-Caffeine (EXCEDRIN PO)   Oral   Take 2 tablets by mouth daily as needed. For headaches         . cephALEXin (KEFLEX) 500 MG capsule   Oral   Take 1 capsule (500 mg total) by mouth 2 (two) times daily.   14 capsule   0   . HYDROcodone-acetaminophen (NORCO) 5-325 MG per tablet   Oral   Take 1 tablet by mouth every 6 (six) hours as needed for pain.   20 tablet   0   . fluconazole (DIFLUCAN) 200 MG tablet   Oral   Take 1 tablet (200 mg total) by mouth daily.   7 tablet  0     BP 149/103  Pulse 96  Temp(Src) 99 F (37.2 C) (Oral)  Resp 22  SpO2 99%  LMP 03/29/2012  Physical Exam  Nursing note and vitals reviewed. Constitutional: She is oriented to person, place, and time. She appears well-developed and well-nourished. No distress.  HENT:  Head: Normocephalic.  Eyes: Conjunctivae are normal.  Cardiovascular: Normal rate and regular rhythm.   Pulmonary/Chest: Effort normal and breath sounds normal.  Genitourinary:  Chaperone was present. There is swelling and tenderness to the left labia consistent with recurrent Bartholin abscess. No active bleeding or drainage. There is small amounts of nearby surrounding thick white vaginal discharge.  Musculoskeletal: Normal range of motion.  Neurological: She is alert and oriented to person, place, and time.  Skin: Skin is warm and dry.  Psychiatric: She has a normal mood and affect. Her behavior is normal.    ED Course  Procedures   Labs Reviewed  WOUND CULTURE   No results found.   1. Bartholin's gland abscess       MDM  Patient seen and evaluated. Patient returns for current an abscess.  Word  catheter placed. There is also clinical concern for candida infection. Will give prescription for Diflucan. Patient advised to return for reevaluation in 2-3 days.        Angus Seller, [place] 04/22/12 [phone]

## 2012-04-24 LAB — WOUND CULTURE: Culture: NORMAL

## 2012-04-27 NOTE — ED Provider Notes (Signed)
Medical screening examination/treatment/procedure(s) were performed by non-physician practitioner and as supervising physician I was immediately available for consultation/collaboration.  Seven [place] M Talea Manges, MD 04/27/12 1859 

## 2013-08-05 ENCOUNTER — Emergency Department: Payer: Self-pay | Admitting: Emergency Medicine

## 2013-08-05 LAB — CBC WITH DIFFERENTIAL/PLATELET
Basophil #: 0.1 10*3/uL (ref 0.0–0.1)
Basophil %: 0.8 %
EOS PCT: 1.3 %
Eosinophil #: 0.1 10*3/uL (ref 0.0–0.7)
HCT: 39 % (ref 35.0–47.0)
HGB: 13 g/dL (ref 12.0–16.0)
Lymphocyte #: 2.5 10*3/uL (ref 1.0–3.6)
Lymphocyte %: 39.6 %
MCH: 30.7 pg (ref 26.0–34.0)
MCHC: 33.2 g/dL (ref 32.0–36.0)
MCV: 92 fL (ref 80–100)
MONOS PCT: 8.2 %
Monocyte #: 0.5 x10 3/mm (ref 0.2–0.9)
NEUTROS ABS: 3.2 10*3/uL (ref 1.4–6.5)
Neutrophil %: 50.1 %
Platelet: 560 10*3/uL — ABNORMAL HIGH (ref 150–440)
RBC: 4.23 10*6/uL (ref 3.80–5.20)
RDW: 12.3 % (ref 11.5–14.5)
WBC: 6.3 10*3/uL (ref 3.6–11.0)

## 2013-08-05 LAB — URINALYSIS, COMPLETE
Bilirubin,UR: NEGATIVE
Glucose,UR: NEGATIVE mg/dL (ref 0–75)
Ketone: NEGATIVE
Leukocyte Esterase: NEGATIVE
NITRITE: NEGATIVE
PH: 6 (ref 4.5–8.0)
PROTEIN: NEGATIVE
RBC,UR: 1 /HPF (ref 0–5)
Specific Gravity: 1.017 (ref 1.003–1.030)
Squamous Epithelial: 3

## 2013-08-05 LAB — COMPREHENSIVE METABOLIC PANEL
ALBUMIN: 4.2 g/dL (ref 3.4–5.0)
AST: 25 U/L (ref 15–37)
Alkaline Phosphatase: 47 U/L
Anion Gap: 2 — ABNORMAL LOW (ref 7–16)
BILIRUBIN TOTAL: 0.3 mg/dL (ref 0.2–1.0)
BUN: 11 mg/dL (ref 7–18)
CO2: 28 mmol/L (ref 21–32)
Calcium, Total: 9.6 mg/dL (ref 8.5–10.1)
Chloride: 109 mmol/L — ABNORMAL HIGH (ref 98–107)
Creatinine: 1.01 mg/dL (ref 0.60–1.30)
EGFR (Non-African Amer.): >60
Glucose: 81 mg/dL (ref 65–99)
Osmolality: 276 (ref 275–301)
Potassium: 4.3 mmol/L (ref 3.5–5.1)
SGPT (ALT): 15 U/L (ref 12–78)
SODIUM: 139 mmol/L (ref 136–145)
Total Protein: 7.6 g/dL (ref 6.4–8.2)

## 2013-10-23 ENCOUNTER — Emergency Department: Payer: Self-pay | Admitting: Emergency Medicine

## 2013-10-23 LAB — CBC WITH DIFFERENTIAL/PLATELET
BASOS PCT: 2.6 %
Basophil #: 0.2 10*3/uL — ABNORMAL HIGH (ref 0.0–0.1)
EOS ABS: 0.3 10*3/uL (ref 0.0–0.7)
EOS PCT: 4 %
HCT: 36.4 % (ref 35.0–47.0)
HGB: 11.9 g/dL — ABNORMAL LOW (ref 12.0–16.0)
LYMPHS ABS: 2.1 10*3/uL (ref 1.0–3.6)
Lymphocyte %: 32.4 %
MCH: 30.5 pg (ref 26.0–34.0)
MCHC: 32.8 g/dL (ref 32.0–36.0)
MCV: 93 fL (ref 80–100)
MONO ABS: 0.4 x10 3/mm (ref 0.2–0.9)
MONOS PCT: 5.7 %
Neutrophil #: 3.6 10*3/uL (ref 1.4–6.5)
Neutrophil %: 55.3 %
Platelet: 490 10*3/uL — ABNORMAL HIGH (ref 150–440)
RBC: 3.91 10*6/uL (ref 3.80–5.20)
RDW: 12 % (ref 11.5–14.5)
WBC: 6.5 10*3/uL (ref 3.6–11.0)

## 2013-10-23 LAB — COMPREHENSIVE METABOLIC PANEL
ALBUMIN: 3.8 g/dL (ref 3.4–5.0)
ALK PHOS: 47 U/L
ALT: 17 U/L
ANION GAP: 7 (ref 7–16)
BUN: 14 mg/dL (ref 7–18)
Bilirubin,Total: 0.3 mg/dL (ref 0.2–1.0)
CHLORIDE: 109 mmol/L — AB (ref 98–107)
CO2: 22 mmol/L (ref 21–32)
Calcium, Total: 8.4 mg/dL — ABNORMAL LOW (ref 8.5–10.1)
Creatinine: 0.86 mg/dL (ref 0.60–1.30)
EGFR (Non-African Amer.): >60
Glucose: 141 mg/dL — ABNORMAL HIGH (ref 65–99)
OSMOLALITY: 279 (ref 275–301)
Potassium: 3.9 mmol/L (ref 3.5–5.1)
SGOT(AST): 18 U/L (ref 15–37)
SODIUM: 138 mmol/L (ref 136–145)
TOTAL PROTEIN: 7.1 g/dL (ref 6.4–8.2)

## 2013-10-23 LAB — URINALYSIS, COMPLETE
BILIRUBIN, UR: NEGATIVE
Bacteria: NONE SEEN
Blood: NEGATIVE
KETONE: NEGATIVE
Nitrite: NEGATIVE
PH: 6 (ref 4.5–8.0)
PROTEIN: NEGATIVE
SPECIFIC GRAVITY: 1.023 (ref 1.003–1.030)
WBC UR: 4 /HPF (ref 0–5)

## 2013-10-23 LAB — WET PREP, GENITAL

## 2013-10-23 LAB — HCG, QUANTITATIVE, PREGNANCY: Beta Hcg, Quant.: 1422 m[IU]/mL — ABNORMAL HIGH

## 2013-10-23 LAB — GC/CHLAMYDIA PROBE AMP

## 2013-10-25 ENCOUNTER — Other Ambulatory Visit: Payer: Self-pay | Admitting: Obstetrics and Gynecology

## 2013-10-25 LAB — HCG, QUANTITATIVE, PREGNANCY: Beta Hcg, Quant.: 3715 m[IU]/mL — ABNORMAL HIGH

## 2013-11-01 ENCOUNTER — Emergency Department: Payer: Self-pay | Admitting: Emergency Medicine

## 2013-11-01 LAB — LIPASE, BLOOD: Lipase: 73 U/L (ref 73–393)

## 2013-11-01 LAB — COMPREHENSIVE METABOLIC PANEL
ALBUMIN: 3.6 g/dL (ref 3.4–5.0)
ALT: 17 U/L
ANION GAP: 7 (ref 7–16)
AST: 18 U/L (ref 15–37)
Alkaline Phosphatase: 47 U/L
BUN: 13 mg/dL (ref 7–18)
Bilirubin,Total: 0.2 mg/dL (ref 0.2–1.0)
CALCIUM: 8.8 mg/dL (ref 8.5–10.1)
CHLORIDE: 106 mmol/L (ref 98–107)
CO2: 25 mmol/L (ref 21–32)
CREATININE: 0.87 mg/dL (ref 0.60–1.30)
EGFR (African American): >60
Glucose: 86 mg/dL (ref 65–99)
OSMOLALITY: 275 (ref 275–301)
POTASSIUM: 3.8 mmol/L (ref 3.5–5.1)
Sodium: 138 mmol/L (ref 136–145)
Total Protein: 6.9 g/dL (ref 6.4–8.2)

## 2013-11-01 LAB — CBC WITH DIFFERENTIAL/PLATELET
BASOS PCT: 0.7 %
Basophil #: 0.1 10*3/uL (ref 0.0–0.1)
Eosinophil #: 0.2 10*3/uL (ref 0.0–0.7)
Eosinophil %: 1.9 %
HCT: 35.1 % (ref 35.0–47.0)
HGB: 11.5 g/dL — AB (ref 12.0–16.0)
LYMPHS PCT: 36.4 %
Lymphocyte #: 3.2 10*3/uL (ref 1.0–3.6)
MCH: 29.8 pg (ref 26.0–34.0)
MCHC: 32.6 g/dL (ref 32.0–36.0)
MCV: 91 fL (ref 80–100)
Monocyte #: 0.7 x10 3/mm (ref 0.2–0.9)
Monocyte %: 8 %
NEUTROS ABS: 4.7 10*3/uL (ref 1.4–6.5)
Neutrophil %: 53 %
Platelet: 513 10*3/uL — ABNORMAL HIGH (ref 150–440)
RBC: 3.85 10*6/uL (ref 3.80–5.20)
RDW: 11.8 % (ref 11.5–14.5)
WBC: 8.8 10*3/uL (ref 3.6–11.0)

## 2013-11-01 LAB — URINALYSIS, COMPLETE
BACTERIA: NONE SEEN
BLOOD: NEGATIVE
Bilirubin,UR: NEGATIVE
Glucose,UR: NEGATIVE mg/dL (ref 0–75)
KETONE: NEGATIVE
LEUKOCYTE ESTERASE: NEGATIVE
NITRITE: NEGATIVE
Ph: 7 (ref 4.5–8.0)
Protein: NEGATIVE
RBC, UR: NONE SEEN /HPF (ref 0–5)
SPECIFIC GRAVITY: 1.021 (ref 1.003–1.030)
Squamous Epithelial: 4

## 2013-11-01 LAB — HCG, QUANTITATIVE, PREGNANCY: Beta Hcg, Quant.: 37908 m[IU]/mL — ABNORMAL HIGH

## 2013-11-02 ENCOUNTER — Emergency Department: Payer: Self-pay | Admitting: Emergency Medicine

## 2013-11-02 LAB — CBC WITH DIFFERENTIAL/PLATELET
BASOS ABS: 0.1 10*3/uL (ref 0.0–0.1)
BASOS PCT: 1.1 %
EOS PCT: 1.3 %
Eosinophil #: 0.1 10*3/uL (ref 0.0–0.7)
HCT: 36.2 % (ref 35.0–47.0)
HGB: 12.3 g/dL (ref 12.0–16.0)
LYMPHS PCT: 31.7 %
Lymphocyte #: 2.1 10*3/uL (ref 1.0–3.6)
MCH: 30.6 pg (ref 26.0–34.0)
MCHC: 34.1 g/dL (ref 32.0–36.0)
MCV: 90 fL (ref 80–100)
MONO ABS: 0.4 x10 3/mm (ref 0.2–0.9)
MONOS PCT: 6 %
NEUTROS ABS: 4 10*3/uL (ref 1.4–6.5)
Neutrophil %: 59.9 %
PLATELETS: 535 10*3/uL — AB (ref 150–440)
RBC: 4.03 10*6/uL (ref 3.80–5.20)
RDW: 12 % (ref 11.5–14.5)
WBC: 6.7 10*3/uL (ref 3.6–11.0)

## 2013-11-02 LAB — COMPREHENSIVE METABOLIC PANEL
ALT: 19 U/L
Albumin: 3.8 g/dL (ref 3.4–5.0)
Alkaline Phosphatase: 45 U/L — ABNORMAL LOW
Anion Gap: 6 — ABNORMAL LOW (ref 7–16)
BUN: 6 mg/dL — AB (ref 7–18)
Bilirubin,Total: 0.4 mg/dL (ref 0.2–1.0)
CO2: 22 mmol/L (ref 21–32)
Calcium, Total: 8.7 mg/dL (ref 8.5–10.1)
Chloride: 106 mmol/L (ref 98–107)
Creatinine: 0.72 mg/dL (ref 0.60–1.30)
EGFR (African American): >60
EGFR (Non-African Amer.): >60
GLUCOSE: 82 mg/dL (ref 65–99)
OSMOLALITY: 265 (ref 275–301)
POTASSIUM: 3.8 mmol/L (ref 3.5–5.1)
SGOT(AST): 11 U/L — ABNORMAL LOW (ref 15–37)
Sodium: 134 mmol/L — ABNORMAL LOW (ref 136–145)
Total Protein: 7.2 g/dL (ref 6.4–8.2)

## 2013-11-02 LAB — URINALYSIS, COMPLETE
BLOOD: NEGATIVE
Bacteria: NONE SEEN
Bilirubin,UR: NEGATIVE
GLUCOSE, UR: NEGATIVE mg/dL (ref 0–75)
Ketone: NEGATIVE
NITRITE: NEGATIVE
Ph: 7 (ref 4.5–8.0)
Protein: NEGATIVE
SPECIFIC GRAVITY: 1.008 (ref 1.003–1.030)
WBC UR: 2 /HPF (ref 0–5)

## 2013-11-02 LAB — HCG, QUANTITATIVE, PREGNANCY: Beta Hcg, Quant.: 45999 m[IU]/mL — ABNORMAL HIGH

## 2013-11-15 ENCOUNTER — Emergency Department: Payer: Self-pay | Admitting: Emergency Medicine

## 2013-11-25 ENCOUNTER — Inpatient Hospital Stay: Payer: Self-pay | Admitting: Psychiatry

## 2013-11-25 LAB — URINALYSIS, COMPLETE
Bilirubin,UR: NEGATIVE
Blood: NEGATIVE
GLUCOSE, UR: NEGATIVE mg/dL (ref 0–75)
NITRITE: NEGATIVE
PROTEIN: NEGATIVE
Ph: 7 (ref 4.5–8.0)
RBC,UR: 2 /HPF (ref 0–5)
SPECIFIC GRAVITY: 1.015 (ref 1.003–1.030)
Squamous Epithelial: 17
WBC UR: 1 /HPF (ref 0–5)

## 2013-11-25 LAB — SALICYLATE LEVEL

## 2013-11-25 LAB — COMPREHENSIVE METABOLIC PANEL
ALBUMIN: 4.2 g/dL (ref 3.4–5.0)
AST: 18 U/L (ref 15–37)
Alkaline Phosphatase: 51 U/L
Anion Gap: 6 — ABNORMAL LOW (ref 7–16)
BUN: 10 mg/dL (ref 7–18)
Bilirubin,Total: 0.5 mg/dL (ref 0.2–1.0)
CHLORIDE: 102 mmol/L (ref 98–107)
CO2: 26 mmol/L (ref 21–32)
Calcium, Total: 9.9 mg/dL (ref 8.5–10.1)
Creatinine: 0.66 mg/dL (ref 0.60–1.30)
Glucose: 79 mg/dL (ref 65–99)
Osmolality: 266 (ref 275–301)
Potassium: 4 mmol/L (ref 3.5–5.1)
SGPT (ALT): 27 U/L
Sodium: 134 mmol/L — ABNORMAL LOW (ref 136–145)
Total Protein: 8.7 g/dL — ABNORMAL HIGH (ref 6.4–8.2)

## 2013-11-25 LAB — CBC
HCT: 40.1 % (ref 35.0–47.0)
HGB: 13.2 g/dL (ref 12.0–16.0)
MCH: 30.1 pg (ref 26.0–34.0)
MCHC: 32.9 g/dL (ref 32.0–36.0)
MCV: 92 fL (ref 80–100)
Platelet: 670 10*3/uL — ABNORMAL HIGH (ref 150–440)
RBC: 4.39 10*6/uL (ref 3.80–5.20)
RDW: 12.4 % (ref 11.5–14.5)
WBC: 11.1 10*3/uL — ABNORMAL HIGH (ref 3.6–11.0)

## 2013-11-25 LAB — DRUG SCREEN, URINE
AMPHETAMINES, UR SCREEN: NEGATIVE (ref ?–1000)
BARBITURATES, UR SCREEN: NEGATIVE (ref ?–200)
BENZODIAZEPINE, UR SCRN: NEGATIVE (ref ?–200)
CANNABINOID 50 NG, UR ~~LOC~~: POSITIVE (ref ?–50)
Cocaine Metabolite,Ur ~~LOC~~: NEGATIVE (ref ?–300)
MDMA (Ecstasy)Ur Screen: NEGATIVE (ref ?–500)
Methadone, Ur Screen: NEGATIVE (ref ?–300)
OPIATE, UR SCREEN: NEGATIVE (ref ?–300)
PHENCYCLIDINE (PCP) UR S: NEGATIVE (ref ?–25)
Tricyclic, Ur Screen: NEGATIVE (ref ?–1000)

## 2013-11-25 LAB — ACETAMINOPHEN LEVEL: Acetaminophen: <2 ug/mL

## 2013-11-25 LAB — HCG, QUANTITATIVE, PREGNANCY

## 2013-11-25 LAB — ETHANOL: Ethanol: <3 mg/dL
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
## 2014-06-25 NOTE — Consult Note (Signed)
Brief Consult Note: Diagnosis: major depression.   Patient was seen by consultant.   Consult note dictated.   Recommend further assessment or treatment.   Orders entered.   Comments: PSychiatry: Patient seen, chart reviewed, note dictated. [facility].  Electronic Signatures: Breland Trouten, Jackquline DenmarkJohn T (MD)  (Signed 24-Sep-15 19:24)  Authored: Brief Consult Note   Last Updated: 24-Sep-15 19:24 by Audery Amellapacs, [place] Viereck T (MD)
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
## 2014-10-27 IMAGING — US US OB < 14 WEEKS - US OB TV
1 series · 14 of 28 positions shown · non-contrast
Comparison: None.

CLINICAL DATA: Abdominal pain, positive pregnancy test

EXAM:
OBSTETRIC <14 WK ULTRASOUND
TECHNIQUE: Transabdominal ultrasound was performed for evaluation of the
gestation as well as the maternal uterus and adnexal regions.

[Series 1: us ob < 14 weeks - us ob tv · 0.18mm/px · 14 of 87 slices shown]
[im 4/87]
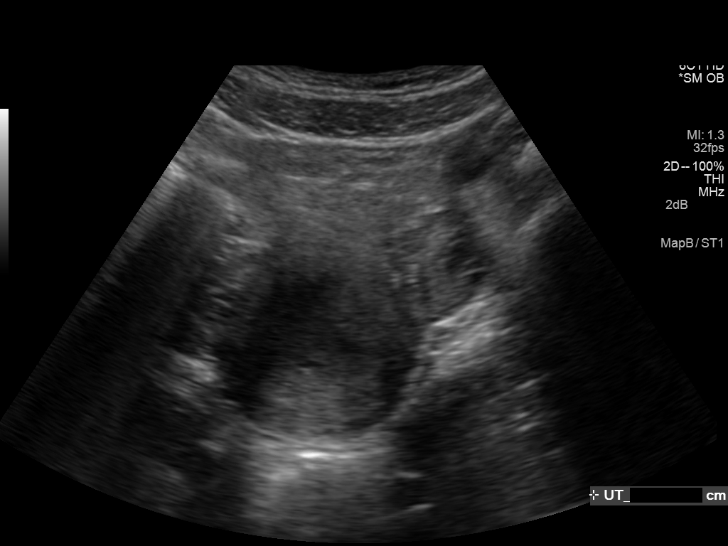
[im 10/87]
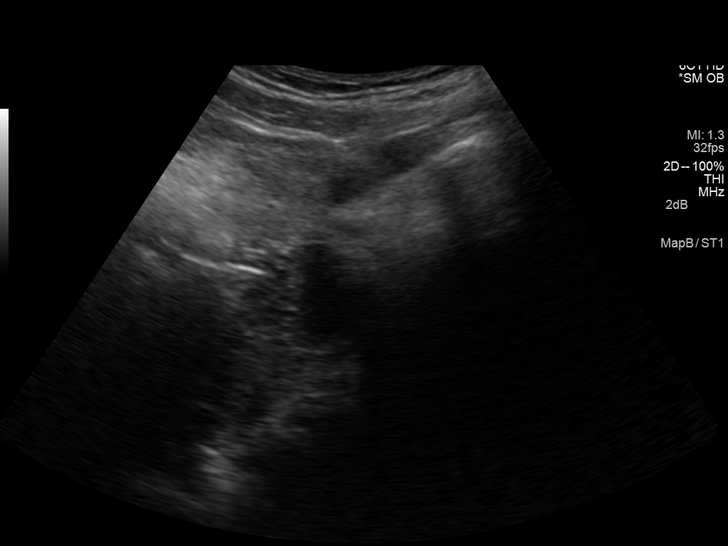
[im 16/87]
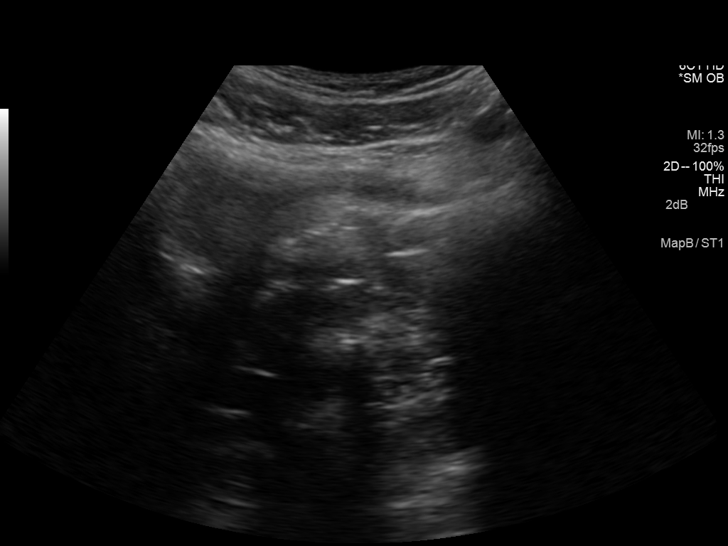
[im 23/87]
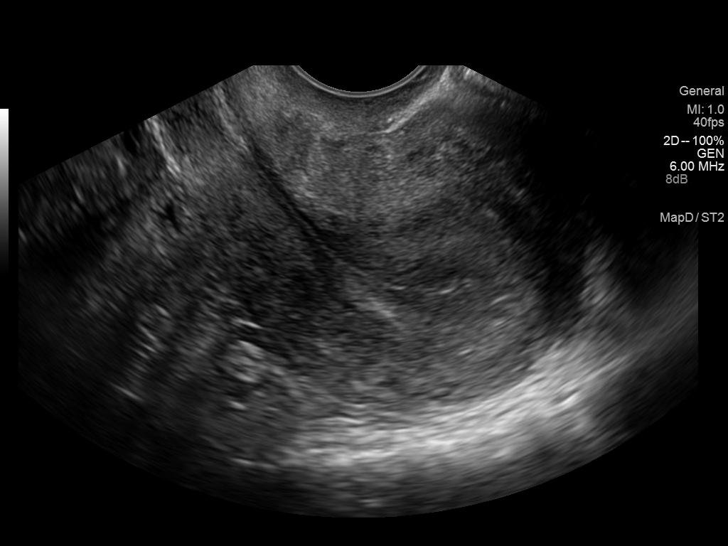
[im 29/87]
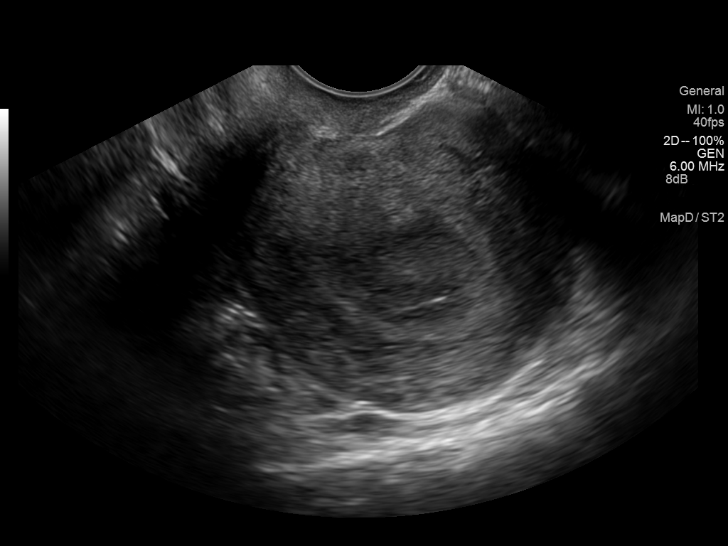
[im 36/87]
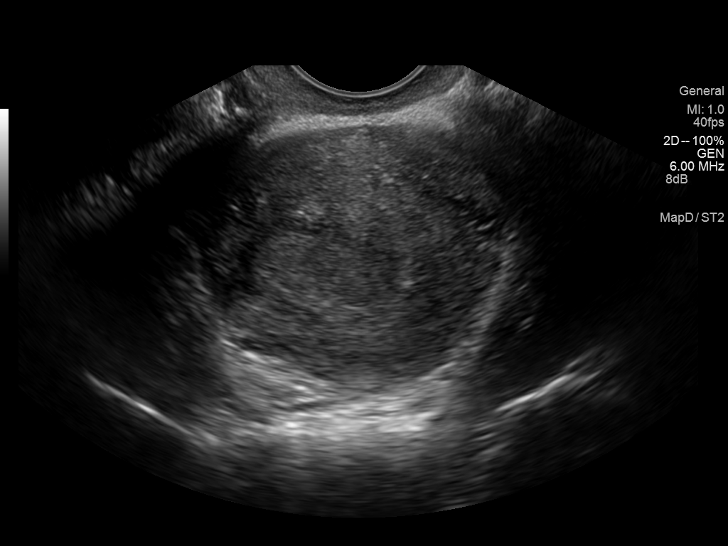
[im 42/87]
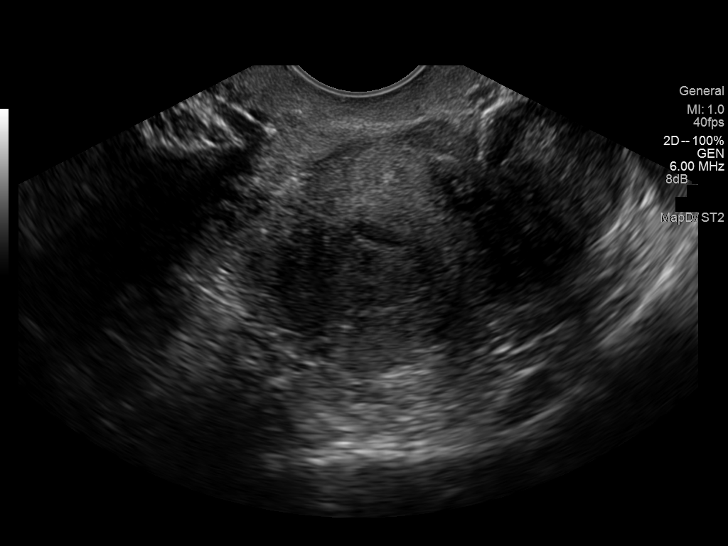
[im 48/87]
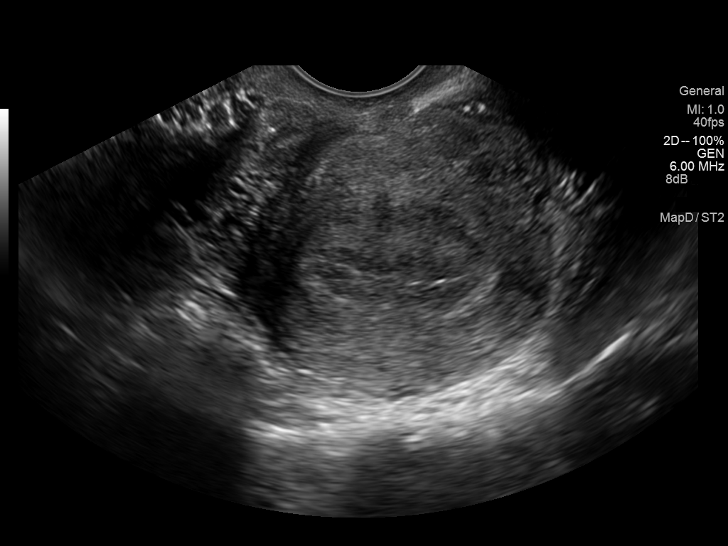
[im 55/87]
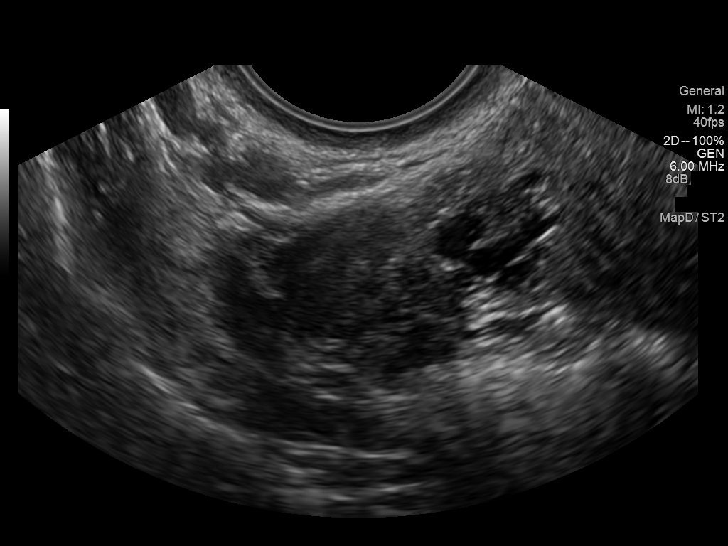
[im 61/87]
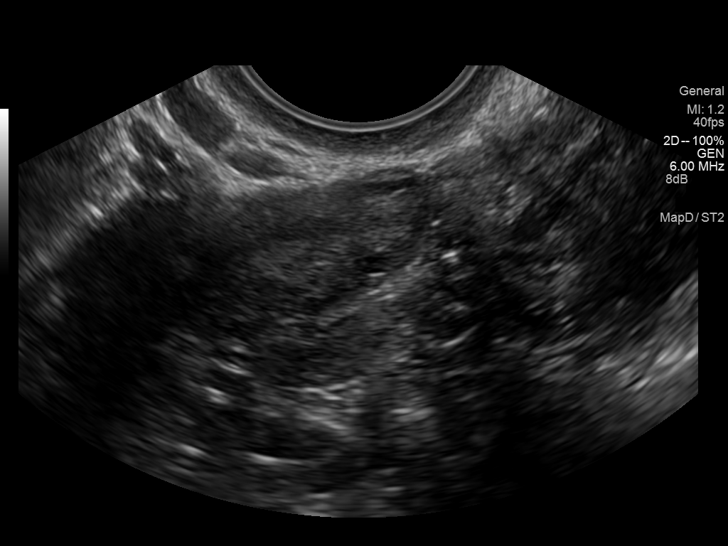
[im 67/87]
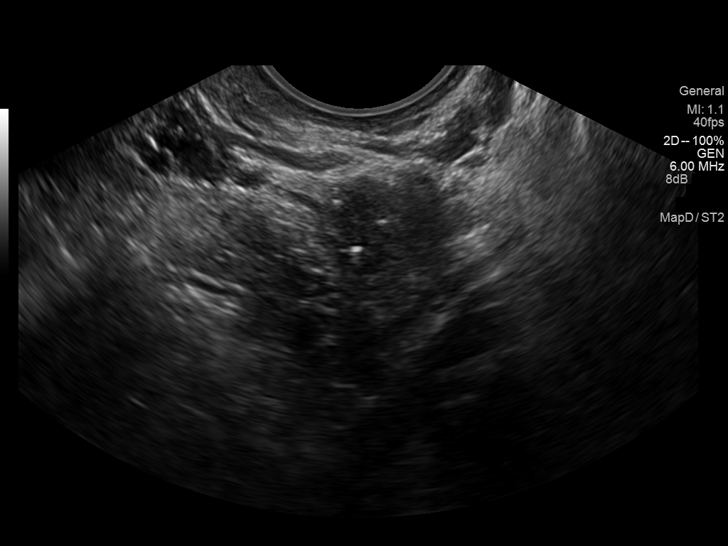
[im 74/87]
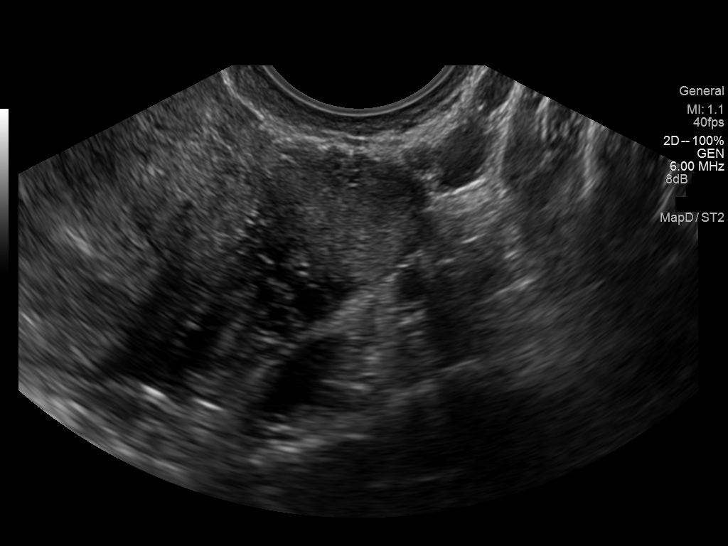
[im 80/87]
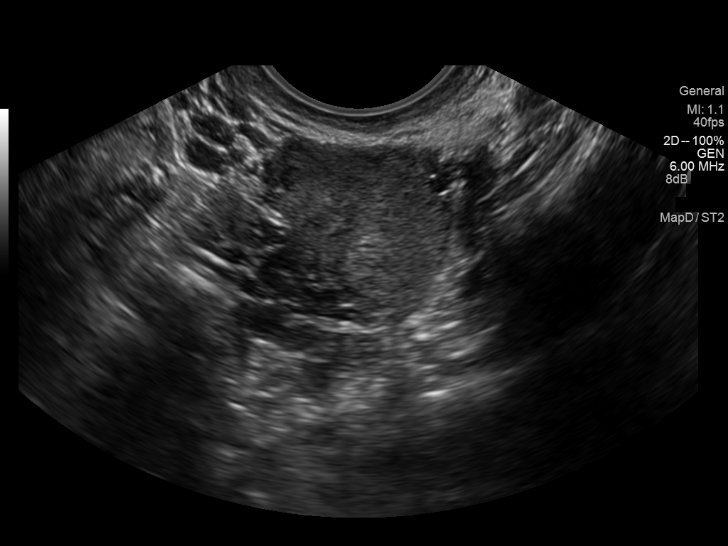
[im 87/87]
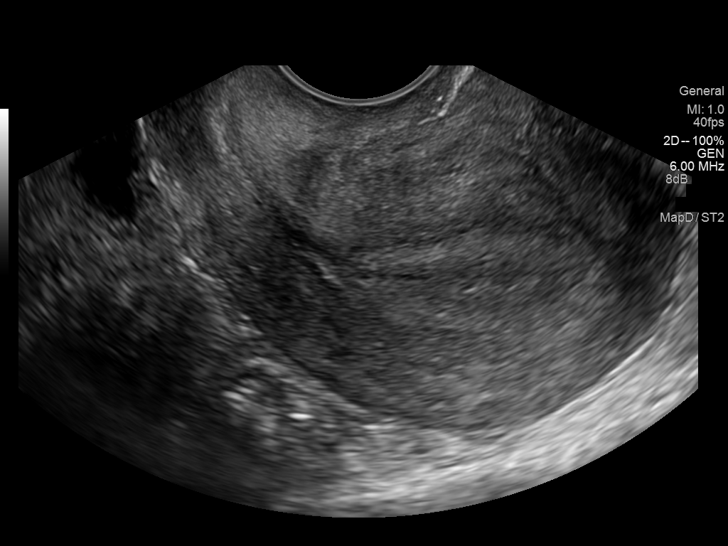

[14 of 28 positions shown; findings below may reference images not displayed]

FINDINGS: Intrauterine gestational sac: Not visualized

Yolk sac:  Not visualized

Embryo:  Not visualized

Cardiac Activity: Not detected

Maternal uterus/adnexae: Uterus measures 7.4 x 5.1 x 4.3 cm and is
retroverted in position. Endometrium is thickened measuring 21 mm
and no visualized gestational sac.

Right ovary appears normal measuring 3.0 x 1.3 x 1.2 cm. The left
ovary measures 2.5 x 1.8 x 2.1 cm and contains a probable 1.9 cm
corpus luteum cyst.

No free fluid.
IMPRESSION: No visualized intrauterine pregnancy.  Thickened 21 mm endometrium.

1.9 cm probable left ovarian corpus luteum cyst. Otherwise no other
adnexal abnormality.

Findings may represent a pregnancy too early to visualize. Difficult
to completely exclude early ectopic. Consider short-term follow-up
to document pregnancy progression.

## 2014-11-19 IMAGING — CR RIGHT HAND - COMPLETE 3+ VIEW
1 series · 3 of 3 positions shown · non-contrast
Comparison: None.

CLINICAL DATA: Status post fall, pain in the fifth metacarpal

EXAM:
RIGHT HAND - COMPLETE 3+ VIEW

[Series 1: pa · 0.17mm/px · 3 of 3 slices shown]
[im 1/3]
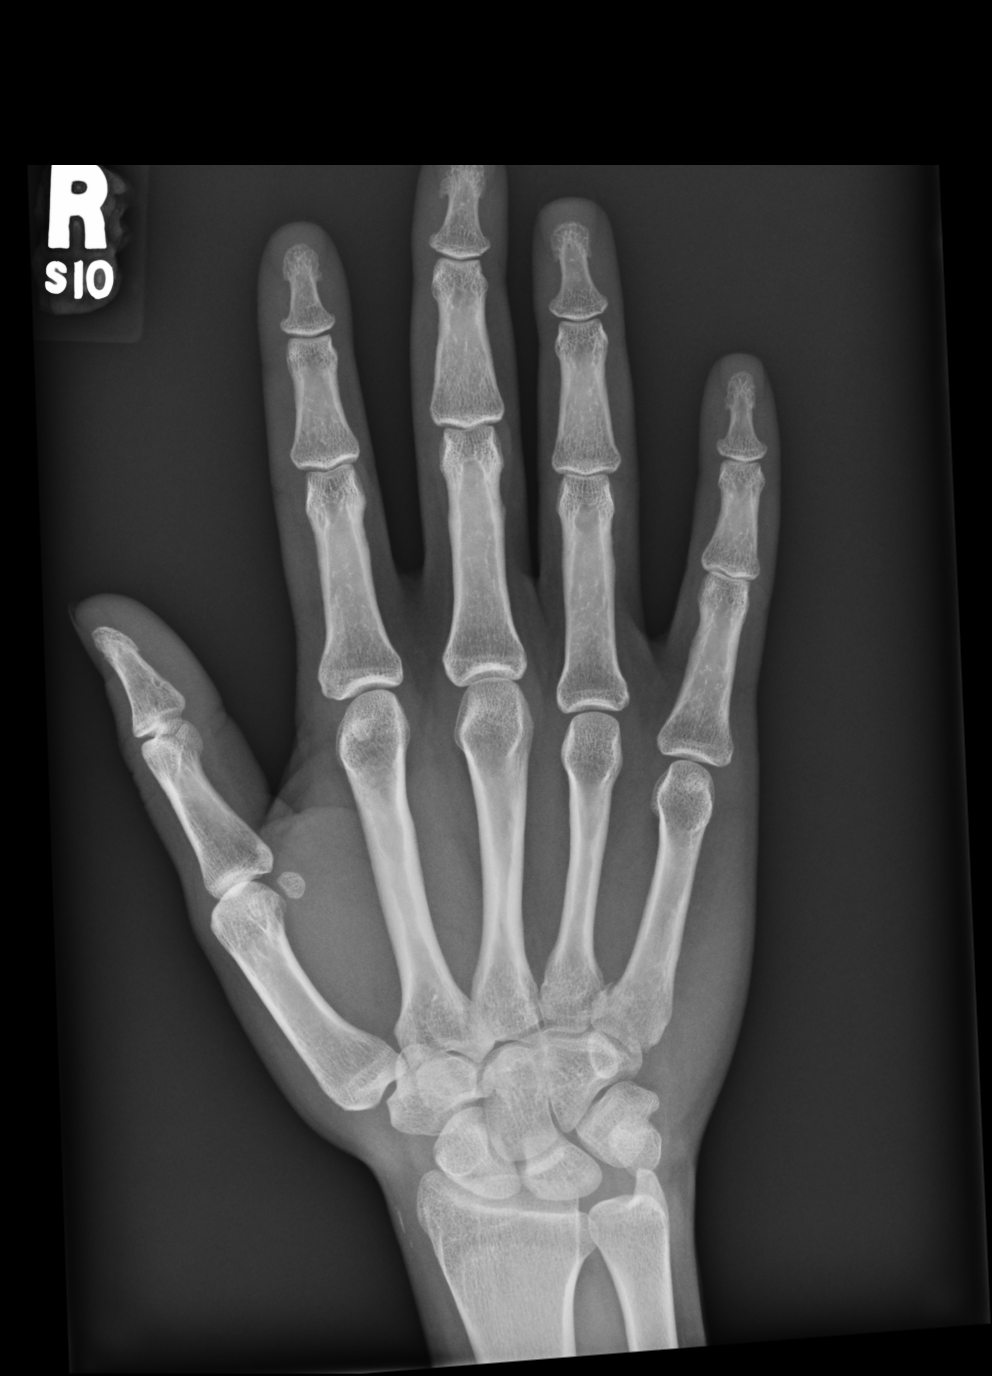
[im 2/3]
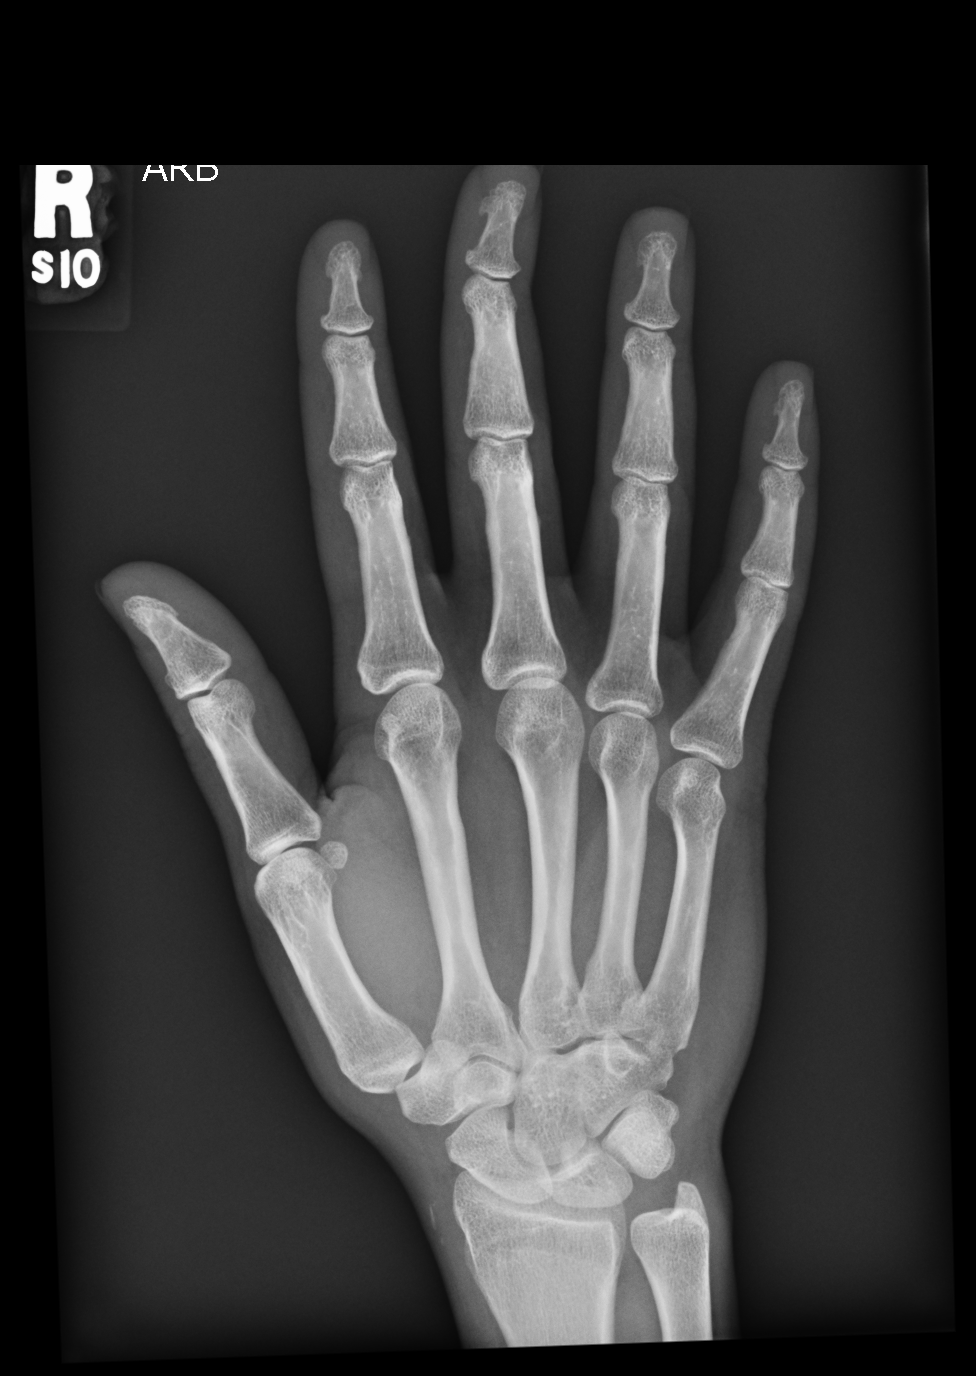
[im 3/3]
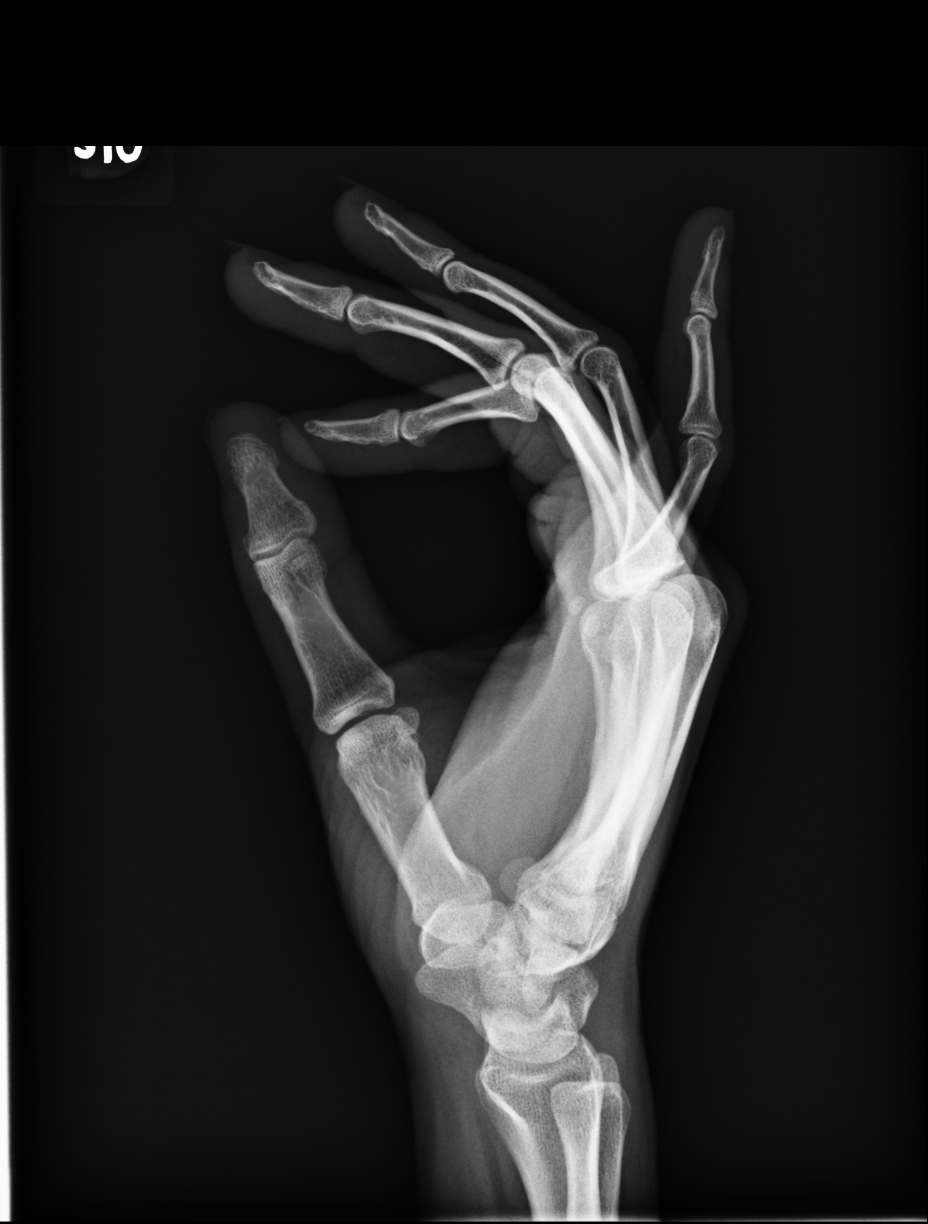

[3 of 3 positions shown; findings below may reference images not displayed]

FINDINGS: There is mild displaced fracture at the base of the fifth
metacarpal. No other acute fracture or dislocation is identified.
IMPRESSION: Mild displaced fracture at the base of the fifth metacarpal.
